# Patient Record
Sex: Male | Born: 1990 | Race: White | Hispanic: No | Marital: Single | Smoking: Never smoker
Health system: Southern US, Community
[De-identification: ages and names within clinical notes are randomized; demographics above are authoritative.]

## PROBLEM LIST (undated history)

## (undated) DIAGNOSIS — K219 Gastro-esophageal reflux disease without esophagitis: Secondary | ICD-10-CM

---
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
## 2008-05-04 ENCOUNTER — Emergency Department (HOSPITAL_COMMUNITY): Admission: EM | Admit: 2008-05-04 | Discharge: 2008-05-04 | Payer: Self-pay | Admitting: Emergency Medicine

## 2008-05-14 ENCOUNTER — Emergency Department (HOSPITAL_COMMUNITY): Admission: EM | Admit: 2008-05-14 | Discharge: 2008-05-14 | Payer: Self-pay | Admitting: Emergency Medicine

## 2008-06-07 ENCOUNTER — Ambulatory Visit: Payer: Self-pay | Admitting: Gastroenterology

## 2008-06-09 ENCOUNTER — Ambulatory Visit (HOSPITAL_COMMUNITY): Admission: RE | Admit: 2008-06-09 | Discharge: 2008-06-09 | Payer: Self-pay | Admitting: Gastroenterology

## 2008-06-15 ENCOUNTER — Emergency Department (HOSPITAL_COMMUNITY): Admission: EM | Admit: 2008-06-15 | Discharge: 2008-06-15 | Payer: Self-pay | Admitting: Emergency Medicine

## 2009-03-23 ENCOUNTER — Emergency Department (HOSPITAL_COMMUNITY): Admission: EM | Admit: 2009-03-23 | Discharge: 2009-03-23 | Payer: Self-pay | Admitting: Emergency Medicine

## 2010-04-28 ENCOUNTER — Emergency Department (HOSPITAL_COMMUNITY): Admission: EM | Admit: 2010-04-28 | Discharge: 2010-04-28 | Payer: Self-pay | Admitting: Emergency Medicine

## 2010-05-18 IMAGING — CR DG CHEST 2V
2 series · 2 of 2 positions shown · non-contrast
Comparison: 06/18/2005

CLINICAL DATA: Shortness of breath

CHEST - 2 VIEW

[view not recorded (1 of 2)]
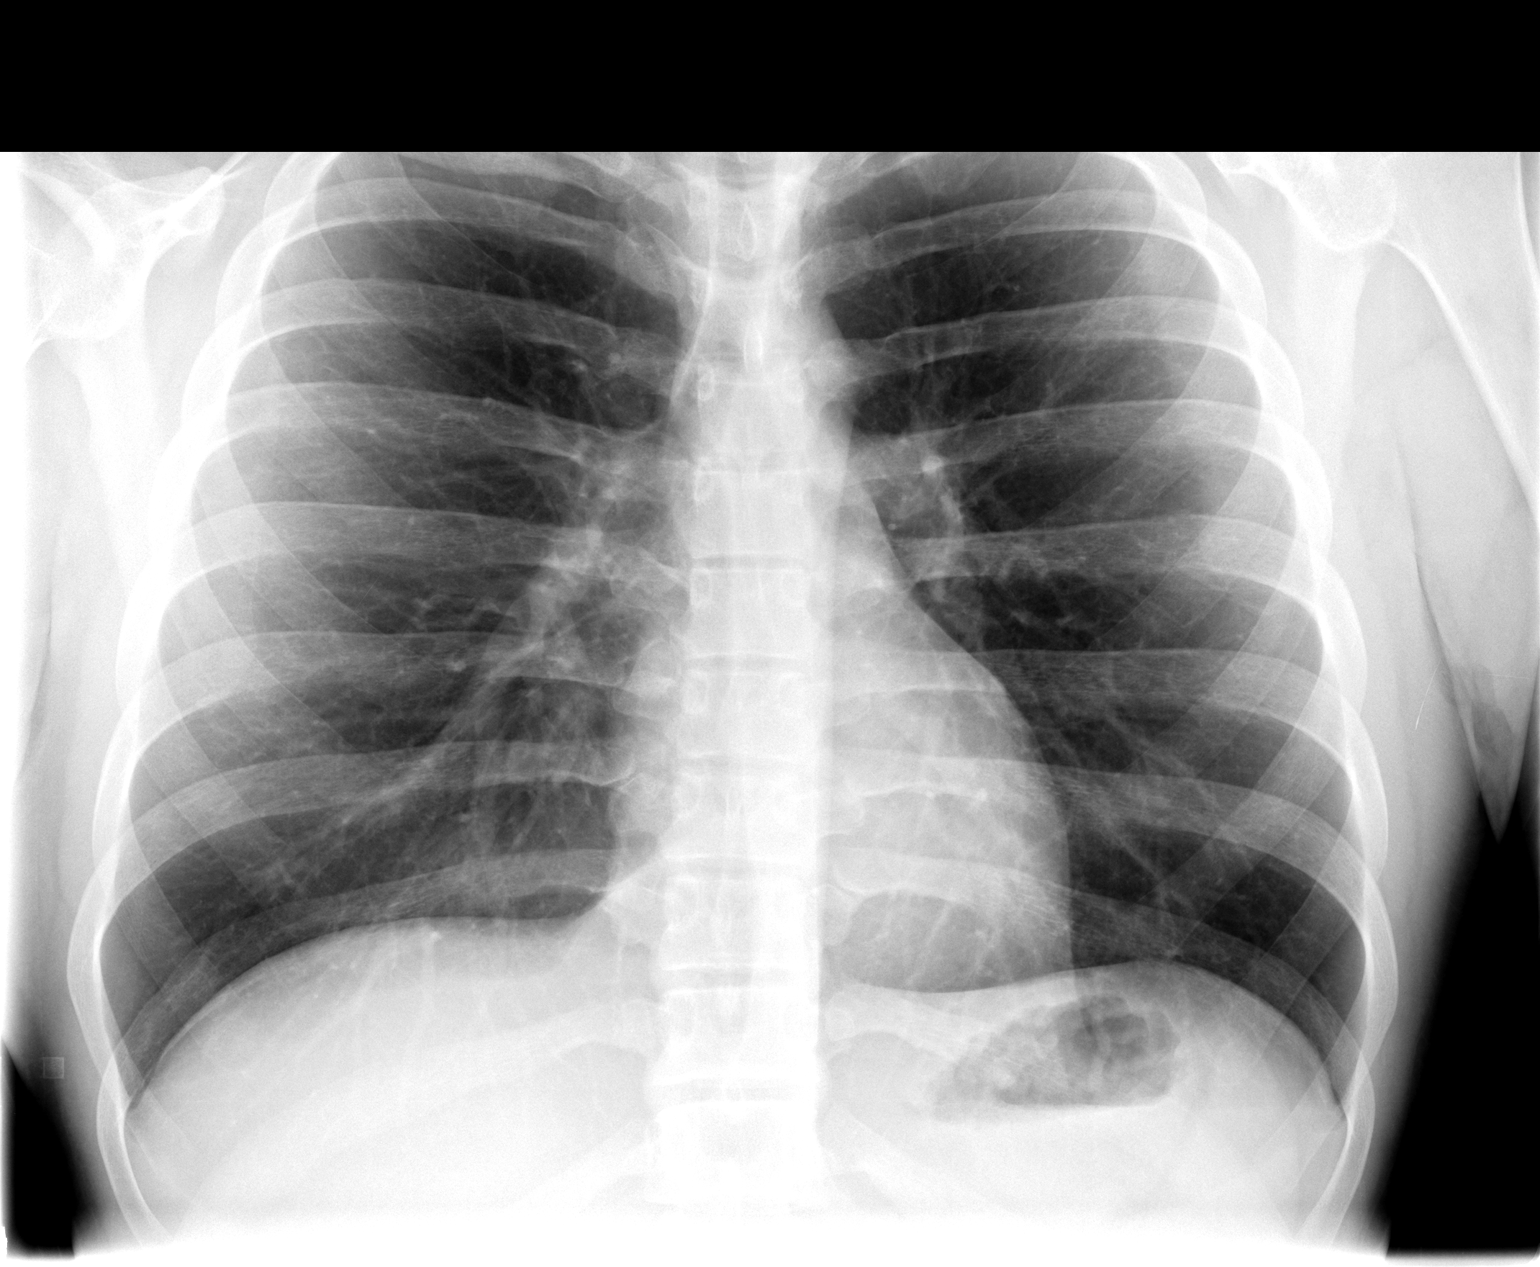

[view not recorded (2 of 2)]
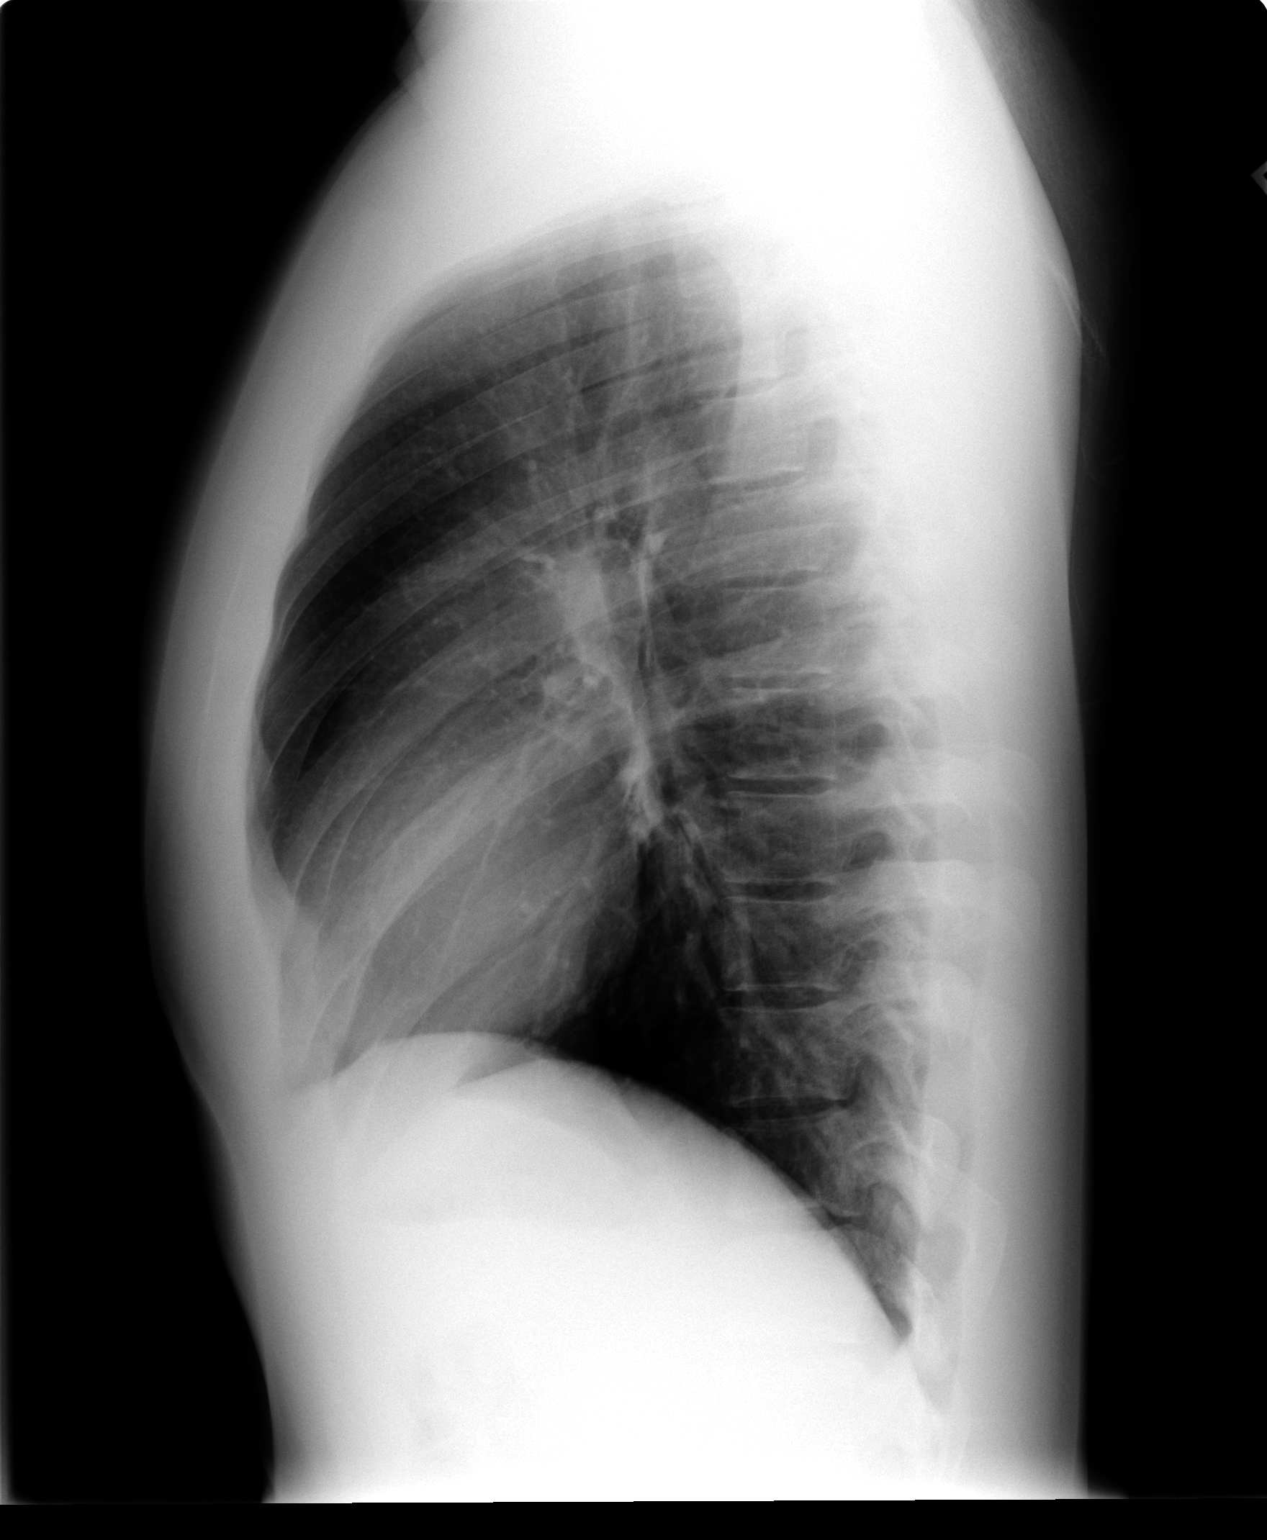

[2 of 2 positions shown; findings below may reference images not displayed]

FINDINGS: The lungs are clear.  The heart is normal.  Mediastinum
and hila are normal.  Osseous structures normal
IMPRESSION: Negative for acute cardiopulmonary process.

## 2010-10-30 ENCOUNTER — Emergency Department (HOSPITAL_COMMUNITY)
Admission: EM | Admit: 2010-10-30 | Discharge: 2010-10-30 | Payer: Self-pay | Source: Home / Self Care | Admitting: Emergency Medicine

## 2011-01-28 LAB — RAPID STREP SCREEN (MED CTR MEBANE ONLY): Streptococcus, Group A Screen (Direct): NEGATIVE

## 2011-01-28 LAB — STREP A DNA PROBE

## 2011-03-05 NOTE — Assessment & Plan Note (Signed)
NAMEMarland Kitchen  Fernando [name], Fernando [name]                CHART#:  [id number]   DATE:  06/07/2008                       DOB:  1991/10/14   CHIEF COMPLAINT:  Abdominal pain and bloating after meals.   SUBJECTIVE:  The patient is here for followup visit.  He was last seen  at the time of EGD in April 2008, when he presented with persistent  dyspepsia and nausea which was made better by constant eating.  It was  felt that he may be having some hypoglycemia as well, given his  dizziness which resolved with meals.  His EGD was normal.  It was  recommended that he have an endocrinology workup to evaluate for  hypoglycemia.  The patient states that he did not have this done.  He  states that he had felt okay.  He continued to have the episodes of  dizziness and nausea which improves when he eats.  He has a new symptom  over the last several weeks and consistent of postprandial bloating and  early satiety.  He gets pain in the epigastrium with meals.  He has  heartburn which is not controlled on ranitidine.  Previously, he did  well on Aciphex, but Medicaid would not pay for it.  He also was on  Protonix in the recent past and  doing well as far as his heartburn.  It  did not seem to help his other symptoms of bloating and early satiety or  nausea, however.  He has lost a total of 16 pounds since March 2008.  He  believes most of it has been in the last couple of months.  He states he  is just not able to eat like he used to.  He denies any dysphagia or  odynophagia.  He states his bowel movements are pretty regular.  Denies  any blood in the stool or melena.   CURRENT MEDICATIONS:  Ranitidine 150 mg b.i.d. started 2 weeks ago.   ALLERGIES:  Codeine causes throat swelling.   PHYSICAL EXAMINATION:  VITAL SIGNS:  Weight 178 pounds, down from 194  pounds March 2008.  Height 5 feet 6 inches, temp 98.1, blood pressure  110/78, and pulse 72.  GENERAL:  A pleasant, well-nourished, well-developed Caucasian male in  no  acute distress.  SKIN:  Warm and dry.  No jaundice.  HEENT:  Sclerae nonicteric.  Oropharyngeal mucosa moist and pink.  CHEST:  Lungs are clear to auscultation.  CARDIAC:  Regular rate and rhythm.  Normal S1 and S2.  No murmurs, rubs,  or gallops.  ABDOMEN:  Flat and nondistended.  Positive bowel sounds.  In the right  mid abdomen, just right of the umbilicus, he has focal tenderness.  This  area is a little more full, but no obvious mass.  No rebound or  guarding.  No organomegaly, abdominal bruits, or hernias.  LOWER EXTREMITIES:  No edema.   IMPRESSION:  The patient is a 20 year old gentleman who presents with  ongoing gastrointestinal symptoms, now with new symptoms of postprandial  epigastric pain, abdominal bloating, and discomfort.  He complains of  early satiety.  EGD a year ago was unremarkable.  He was treated for H.  pylori back then.  He is having refractory gastroesophageal reflux  disease, on ranitidine.  Given postprandial epigastric discomfort,  bloating, and early satiety, he  may be having dyspepsia.  Would be  somewhat atypical for biliary source, but this is not excluded.  Focal  tenderness in the right mid abdomen on exam is concerning.  The patient  does not have any change in bowel movements.  Crohn's disease less  likely.  We would recommend CT to exclude the possibility of underlying  or early appendicitis.   PLAN:  1. CT of the abdomen and pelvis with and without contrast.  2. CBC, LFT, and sed rate.  3. Stop ranitidine and begin omeprazole 40 mg daily, #30 with 5      refills.  4. If CT is unremarkable, we will then consider possibility of      excluding biliary etiology for his symptoms as the next step.   ADDENDUM 65784:  CT SCAN w/ ivc 82009: NAIAP.       Tana Coast, P.A.  Electronically Signed     Kassie Mends, M.D.  Electronically Signed    LL/MEDQ  D:  06/07/2008  T:  06/07/2008  Job:  [id number]   cc:   Corrie Mckusick, M.D.

## 2011-03-08 NOTE — H&P (Signed)
NAME:  Fernando [name], Fernando [name]               ACCOUNT NO.:  [id number]   MEDICAL RECORD NO.:  [id number]          PATIENT TYPE:  AMB   LOCATION:  DAY                           FACILITY:  [facility]   PHYSICIAN:  Vickki Hearing, M.D.DATE OF BIRTH:  29-Jun-1991   DATE OF ADMISSION:  DATE OF DISCHARGE:  LH                                HISTORY & PHYSICAL   HISTORY OF PRESENT ILLNESS:  The patient has pins in his right wrist which  need to be removed.   He had open treatment internal fixation of a severely displaced right distal  radius fracture in November 09, 2004.  He presented to have the pins removed  but ate or drank last time and had to be rescheduled.   PAST MEDI CAL/SURGICAL/FAMILY HISTORY:  Reveal only a history of cancer.   SOCIAL HISTORY:  Negative.   ALLERGIES:  No allergies.   MEDICATIONS:  Vicodin.   PHYSICAL EXAMINATION:  VITAL SIGNS:  Weight 165 pounds.  Vital signs stable.  DEVELOPMENT:  Development, grooming, and hygiene normal.  HEENT:  Normal.  NECK:  Supple.  CHEST:  Clear.  HEART:  Rate and rhythm normal.  ABDOMEN:  Soft.   Previous incisions are healed well over the right wrist.  He is  neurovascularly intact.  No deformity is noted.   Radiographs show fracture is healed.   PLAN:  Plan is to take the pins out of the right wrist.      SEH/MEDQ  D:  02/07/2005  T:  02/07/2005  Job:  [id number]

## 2011-03-08 NOTE — H&P (Signed)
NAME:  Fernando [name], Fernando [name]               ACCOUNT NO.:  [id number]   MEDICAL RECORD NO.:  [id number]          PATIENT TYPE:  AMB   LOCATION:  DAY                           FACILITY:  [facility]   PHYSICIAN:  Vickki Hearing, M.D.DATE OF BIRTH:  [birth year]   DATE OF ADMISSION:  DATE OF DISCHARGE:  LH                                HISTORY & PHYSICAL   CHIEF COMPLAINT:  Pins need to be removed from right wrist.   HISTORY OF PRESENT ILLNESS:  This patient underwent open treatment internal  fixation of a severely displaced right distal radius fracture in January of  2006 and he presents now to have the pins removed.  The surgery date was  November 09, 2004.   PAST MEDICAL HISTORY:  Benign.   PAST SURGICAL HISTORY:  No previous surgery.   FAMILY HISTORY:  Family history of cancer.   SOCIAL HISTORY:  Negative.   ALLERGIES:  None.   MEDICATIONS:  Vicodin.   PHYSICAL EXAMINATION:  WEIGHT:  165 pounds.  VITAL SIGNS:  Pulse 64, respiratory rate 18 last recorded.  HEENT:  Normal.  NECK:  Supple.  CHEST:  Clear.  HEART:  Rate and rhythm normal.  ABDOMEN:  Soft.  His right wrist has no clinical deformity.  EXTREMITIES:  There are incisions from previous surgery which are well  healed.  He is neurovascularly intact.   His radiographs show the fracture to be healed.  The pins can be taken out.   DIAGNOSIS:  Right distal radius fracture.   PLAN:  Remove pins from right distal radius.      SEH/MEDQ  D:  01/14/2005  T:  01/14/2005  Job:  [id number]   cc:   [facility]  Fax: [phone]

## 2011-03-08 NOTE — Op Note (Signed)
[name], [name]               ACCOUNT NO.:  [id number]   MEDICAL RECORD NO.:  [id number]          PATIENT TYPE:  AMB   LOCATION:  DAY                           FACILITY:  [facility]   PHYSICIAN:  Vickki Hearing, M.D.DATE OF BIRTH:  1991/03/11   DATE OF PROCEDURE:  02/08/2005  DATE OF DISCHARGE:                                 OPERATIVE REPORT   PREOPERATIVE DIAGNOSIS:  Distal radius fracture.   POSTOPERATIVE DIAGNOSIS:  Distal radius fracture, right wrist.   PROCEDURE:  Removal of hardware, pins, right wrist.   SURGEON:  Dr. Romeo Apple. No assistants.   ANESTHETIC:  General.   HISTORY:  This is a 20 year old male who fractured his right distal radius  at the growth plate. Had an open reduction internal fixation with K-wires  which were buried. He came back for hardware removal.   The patient was identified in preop holding area. He marked his right wrist  as the surgical site. It was countersigned by the surgeon. History and  physical was updated. Antibiotics were given, 1 gram IV Ancef. He was taken  to the operating room for general anesthesia. After successful anesthesia,  his right upper extremity was prepped and draped using sterile technique. A  time-out was taken. Procedure was confirmed as hardware removal right wrist,  [name], antibiotics confirmed to be given within an hour of skin  incision. Hardware and instruments needed for procedure were confirmed to be  in the room.   Using two small stab wounds over the palpable pins, the pins were removed  and the wounds were closed with 3-0 nylon. We pre-injected and post-injected  Marcaine with epinephrine and plain Marcaine respectively and placed sterile  dressing. He was extubated and taken to recovery room in stable condition.  He is discharged on Lortab 5 mg 1 q.4h. #30 with no refills. He will follow-  up on Monday for wound check      SEH/MEDQ  D:  02/08/2005  T:  02/08/2005  Job:  [id number]

## 2011-03-08 NOTE — Consult Note (Signed)
NAME:  Fernando [name], Fernando [name]               ACCOUNT NO.:  [id number]   MEDICAL RECORD NO.:  [id number]          PATIENT TYPE:  AMB   LOCATION:  DAY                           FACILITY:  [facility]   PHYSICIAN:  Kassie Mends, M.D.      DATE OF BIRTH:  [birth year]   DATE OF CONSULTATION:  12/30/2006  DATE OF DISCHARGE:                                 CONSULTATION   REASON FOR CONSULTATION:  Reflux.   REFERRING PHYSICIAN:  Corrie Mckusick, M.D.   HISTORY OF PRESENT ILLNESS:  Fernando [name] is an 20 year old male who  complains of his stomach grumbling.  He states that he gets shaky if he  does not eat.  His weight has been fluctuating.  He feels hungry all the  times, so he is constantly eating.  His H. pylori serology was checked  in February and it was 2.3.  He was treated with amoxicillin and Biaxin  as well as Aciphex.  He felt better, but his symptoms have now returned.  He is more nauseated.  He has to eat something to make the nausea go  way.  The Aciphex has helped with his indigestion, but it kind of wears  off at nighttime.  He denies any vomiting.  He has soft stool.  He has  zero to two to three bowel movements a day.  He denies any blood in his  stool or black tarry stools.  He feels thirsty all the time.  He did  have his glucose checked in Februaryand it was 93.  He denies any  burning when he urinates or blood in his urine.  He says he has normal  urinary patterns.  For the last year, he has been feeling lightheaded or  dizzy if he does not eat.  He states that protein sticks with him  better.  Occasionally, he has sharp epigastric pain especially if he  eats.   PAST MEDICAL HISTORY:  History of H. pylori infection.   PAST SURGICAL HISTORY:  Wrist surgery.   ALLERGIES:  BENADRYL makes him feel like he cannot breathe.  IBUPROFEN  makes him feel like he cannot breathe.   MEDICATIONS:  1. Aciphex 20 mg twice a day.  2. Amoxicillin 500 mg three times a day from the dentist.   FAMILY  HISTORY:  His grandmother had colon cancer at age 55.  His  grandfather had colon cancer at age 60.  He denies any family history of  uterine, breast or ovarian cancer.   SOCIAL HISTORY:  He has a child that is 1 year and 3 months.  He is  working on his GED from the [employer].  He quit smoking 1 year  ago.  Denies any alcohol use.  His mother accompanied him to the visit.   REVIEW OF SYSTEMS:  Per the HPI, otherwise all systems negative.   PHYSICAL EXAMINATION:  VITAL SIGNS:  Weight 194 pounds, height 5 feet 8  inches, BMI 29.5 (overweight).  Temperature 98.5, blood pressure 110/88,  pulse 80.  GENERAL:  He is no apparent distress, alert and orient x4.  HEENT:  Atraumatic, normocephalic.  Pupils equal, react to light.  Mouth  with no oral lesions.  Posterior pharynx without erythema or exudate.  NECK:  Full range of motion with no lymphadenopathy.  LUNGS:  Clear to  auscultation bilaterally.  CARDIOVASCULAR:  Regular rhythm, no murmur,  normal S1 and S2.  ABDOMEN:  Bowel sounds present, soft, nontender,  nondistended.  No rebound or guarding.  Unable to appreciate  hepatosplenomegaly, abdominal bruits or pulsatile masses.  EXTREMITIES:  Without cyanosis, clubbing or edema.  NEUROLOGIC:  No focal neurologic deficits.   LABORATORY DATA AND X-RAY FINDINGS:  Alk phos 115, AST 60 and ALT 16,  albumin 4.9, total bilirubin 0.6.   ASSESSMENT:  Fernando [name] is an 20 year old male with nausea that is  alleviated with food.  He also has epigastric pain.  He may have peptic  ulcer disease and differential diagnosis includes mucosa-associated  lymphoid tissue lymphoma (MALT) lymphoma or eosinophilic gastritis.  Thank you for allowing me to see Fernando [name] in consultation.   RECOMMENDATIONS:  1. An EGD will be performed to further investigate his significant      nausea and epigastric pain.  2. If the EGD does not reveal etiology for his significant nausea,      then other etiologies  may need to be investigated to include an      endocrinology referral.  3. A follow up appointment will be arranged after his endoscopy is      completed.      Kassie Mends, M.D.  Electronically Signed     SM/MEDQ  D:  01/01/2007  T:  01/03/2007  Job:  [id number]   cc:   Corrie Mckusick, M.D.  Fax: [phone]

## 2011-03-08 NOTE — Op Note (Signed)
NAME:  REBEKAH, SPRINKLE NO.:  [id number]   MEDICAL RECORD NO.:  [id number]          PATIENT TYPE:  EMS   LOCATION:  ED                            FACILITY:  [facility]   PHYSICIAN:  Vickki Hearing, M.D.DATE OF BIRTH:  [birth year]   DATE OF PROCEDURE:  DATE OF DISCHARGE:                                 OPERATIVE REPORT   INDICATIONS FOR PROCEDURE:  Severely displaced right distal radius fracture.   PREOPERATIVE DIAGNOSIS:  Closed right distal radius fracture.   POSTOPERATIVE DIAGNOSIS:  Closed right distal radius fracture, distal  radioulnar joint subluxation/dislocation.   SURGEON:  Vickki Hearing, M.D.   ANESTHESIA:  General.   TOURNIQUET TIME:  Fifty-six minutes.   OPERATIVE FINDINGS:  Open comminuted distal radius fracture with distal  radioulnar joint dislocation dorsally.   FIXATION USED:  Three K-wires.   APPROACH USED:  Dorsal approach to the third and fourth dorsal compartments.   DESCRIPTION OF PROCEDURE:  This is a 20 year old male who was identified in  the ER.  His right wrist was marked as a surgical site.  He was taken to the  operating room, given anesthetic and Ancef, and sterile prep and drape of  his right upper extremity.  Time out was taken in the procedure.  Extremity  _________ confirmed as the right upper extremity.   Closed manipulation was attempted but was unsuccessful; therefore, the  fracture was opened through the third and fourth dorsal compartments.  Extensor tendons were protected.  Extensor retinaculum was entered.  The  extensor pollicis longus tendon was moved radially and out of the way.  A  periosteal elevator was used to gently lift the distal fragment back over  the proximal fragment and ulnarly.  This reduced the fracture, and the  radiographs confirmed that; however, it was noted on the lateral view that  the distal ulna was subluxated and dislocated dorsally; therefore, the arm  was supinated, and a K-wire was  driven across the [facility].  The other two K-  wires that were placed were buried beneath the skin, and these were placed  across the fracture site.  The wound was irrigated.  The dorsal comminuted  bone was used for autogenous bone graft.  The wound was closed by closing  the retinaculum, a subcu layer, and closing the skin.  We used 2-0 and 3-0  Monocryl suture.  We injected 30 cc of Marcaine, released the tourniquet,  applied a volar splint.  We extubated the patient, who was taken to the  recovery room in stable condition.   We would like to follow up on Wednesday.  He is discharged on Phenergan 25  mg 1 q.6h. p.r.n. nausea, Vicodin 5 mg 1 q.4h. p.r.n. #60 with 2 refills.  This is for pain.  He is also given ibuprofen 800 mg p.o. t.i.d. #90 with 2  refills, also for pain and swelling.      SEH/MEDQ  D:  11/09/2004  T:  11/09/2004  Job:  [id number]

## 2011-03-08 NOTE — Op Note (Signed)
Fernando [name], Fernando [name]               ACCOUNT NO.:  [id number]   MEDICAL RECORD NO.:  [id number]          PATIENT TYPE:  AMB   LOCATION:  DAY                           FACILITY:  [facility]   PHYSICIAN:  Kassie Mends, M.D.      DATE OF BIRTH:  [birth year]   DATE OF PROCEDURE:  01/28/2007  DATE OF DISCHARGE:  01/28/2007                               OPERATIVE REPORT   REFERRING PHYSICIAN:  Corrie Mckusick, M.D.   PROCEDURE:  Esophagogastroduodenoscopy.   INDICATION FOR EXAM:  Fernando [name] is an 20 year old male who complains  of persistent dyspepsia and nausea unless he is constantly eating.  His  body mass index is 29.8 and he is overweight.  He also complains of  epigastric pain.  His nausea is alleviated by food.  This EGD is  performed to investigate the reason for his nausea and his epigastric  pain.   FINDINGS:  Normal esophagus without evidence of Barrett's, erosions, or  ulcerations.  Normal stomach.  Normal duodenal bulb and second portion  of the duodenum.   RECOMMENDATIONS:  1. I spoke with Dr. Phillips Odor and recommended an endocrinology workup to      evaluate for hypoglycemia.  His nausea is often associated with      dizziness.  2. He is also asked to avoid gastric irritants.  3. It is possible that his epigastric pain may be related to      gastritis, and he may continue his AcipHex.  4. He has a follow-up appointment to see me in 6 weeks to reevaluate      his epigastric pain and his nausea.  5. He is given a handout on gastric irritants.   MEDICATIONS:  1. Demerol 100 mg IV.  2. Versed 8 mg IV.  3. Phenergan 12.5 mg IV.   PROCEDURE TECHNIQUE:  Physical exam was performed and informed consent  was obtained from the patient after explaining benefits, risks and  alternatives to the procedure.  He was connected to the monitor and  placed in left lateral position.  Continuous oxygen was provided by  nasal cannula and IV medicine was administered through an indwelling  cannula.  After administration of sedation, the patient's esophagus was  intubated and the scope was  advanced under direct visualization to the second portion of the  duodenum.  The scope was subsequently removed slowly by carefully  examine the color, texture, anatomy and integrity of the mucosa on the  way out.  The patient was recovered in endoscopy and discharged to home  in satisfactory condition.      Kassie Mends, M.D.  Electronically Signed     SM/MEDQ  D:  01/29/2007  T:  01/30/2007  Job:  [id number]   cc:   Corrie Mckusick, M.D.  Fax: [phone]

## 2011-07-19 LAB — CBC
HCT: 43.5
MCHC: 34.2
MCV: 89.1
Platelets: 193
RDW: 13.1

## 2011-07-19 LAB — DIFFERENTIAL
Basophils Absolute: 0
Basophils Relative: 1
Eosinophils Absolute: 0.1
Eosinophils Relative: 2
Lymphs Abs: 1.9
Neutrophils Relative %: 49

## 2011-07-19 LAB — BASIC METABOLIC PANEL
BUN: 15
CO2: 31
Chloride: 104
Creatinine, Ser: 0.79
Glucose, Bld: 83
Potassium: 4.2

## 2012-02-14 ENCOUNTER — Emergency Department: Payer: Self-pay | Admitting: *Deleted

## 2012-02-23 ENCOUNTER — Emergency Department: Payer: Self-pay | Admitting: Emergency Medicine

## 2017-07-14 ENCOUNTER — Other Ambulatory Visit: Payer: Self-pay | Admitting: Student

## 2017-07-14 DIAGNOSIS — K5900 Constipation, unspecified: Secondary | ICD-10-CM

## 2017-07-14 DIAGNOSIS — R1010 Upper abdominal pain, unspecified: Secondary | ICD-10-CM

## 2017-07-14 DIAGNOSIS — R140 Abdominal distension (gaseous): Secondary | ICD-10-CM

## 2017-07-14 DIAGNOSIS — K30 Functional dyspepsia: Secondary | ICD-10-CM

## 2017-07-18 ENCOUNTER — Ambulatory Visit
Admission: RE | Admit: 2017-07-18 | Discharge: 2017-07-18 | Disposition: A | Discharge status: Home / Self Care | Payer: 59 | Source: Ambulatory Visit | Attending: Student | Admitting: Student

## 2017-07-18 DIAGNOSIS — K219 Gastro-esophageal reflux disease without esophagitis: Secondary | ICD-10-CM | POA: Insufficient documentation

## 2017-07-18 DIAGNOSIS — R140 Abdominal distension (gaseous): Secondary | ICD-10-CM | POA: Insufficient documentation

## 2017-07-18 DIAGNOSIS — R1010 Upper abdominal pain, unspecified: Secondary | ICD-10-CM | POA: Diagnosis not present

## 2017-07-18 DIAGNOSIS — K30 Functional dyspepsia: Secondary | ICD-10-CM | POA: Diagnosis not present

## 2017-07-18 DIAGNOSIS — K5900 Constipation, unspecified: Secondary | ICD-10-CM | POA: Insufficient documentation

## 2019-02-23 ENCOUNTER — Other Ambulatory Visit: Payer: Self-pay | Admitting: Student

## 2019-02-23 DIAGNOSIS — R140 Abdominal distension (gaseous): Secondary | ICD-10-CM

## 2019-02-23 DIAGNOSIS — K219 Gastro-esophageal reflux disease without esophagitis: Secondary | ICD-10-CM

## 2019-02-23 DIAGNOSIS — K2970 Gastritis, unspecified, without bleeding: Secondary | ICD-10-CM

## 2019-03-08 ENCOUNTER — Other Ambulatory Visit: Payer: Self-pay

## 2019-03-08 ENCOUNTER — Ambulatory Visit
Admission: RE | Admit: 2019-03-08 | Discharge: 2019-03-08 | Disposition: A | Discharge status: Home / Self Care | Payer: BLUE CROSS/BLUE SHIELD | Source: Ambulatory Visit | Attending: Student | Admitting: Student

## 2019-03-08 DIAGNOSIS — K2970 Gastritis, unspecified, without bleeding: Secondary | ICD-10-CM | POA: Insufficient documentation

## 2019-03-08 DIAGNOSIS — K219 Gastro-esophageal reflux disease without esophagitis: Secondary | ICD-10-CM

## 2019-03-08 DIAGNOSIS — R140 Abdominal distension (gaseous): Secondary | ICD-10-CM | POA: Diagnosis present

## 2019-03-16 ENCOUNTER — Ambulatory Visit: Payer: 59

## 2019-09-14 ENCOUNTER — Other Ambulatory Visit: Payer: Self-pay

## 2019-09-14 DIAGNOSIS — Z20822 Contact with and (suspected) exposure to covid-19: Secondary | ICD-10-CM

## 2019-09-15 LAB — NOVEL CORONAVIRUS, NAA: SARS-CoV-2, NAA: NOT DETECTED

## 2019-10-11 ENCOUNTER — Ambulatory Visit
Admission: EM | Admit: 2019-10-11 | Discharge: 2019-10-11 | Disposition: A | Discharge status: Home / Self Care | Payer: BC Managed Care – PPO | Attending: Family Medicine | Admitting: Family Medicine

## 2019-10-11 ENCOUNTER — Other Ambulatory Visit: Payer: Self-pay

## 2019-10-11 DIAGNOSIS — R509 Fever, unspecified: Secondary | ICD-10-CM

## 2019-10-11 DIAGNOSIS — R05 Cough: Secondary | ICD-10-CM

## 2019-10-11 DIAGNOSIS — R519 Headache, unspecified: Secondary | ICD-10-CM

## 2019-10-11 DIAGNOSIS — B349 Viral infection, unspecified: Secondary | ICD-10-CM

## 2019-10-11 HISTORY — DX: Gastro-esophageal reflux disease without esophagitis: K21.9

## 2019-10-11 LAB — RAPID INFLUENZA A&B ANTIGENS
Influenza A (ARMC): NEGATIVE
Influenza B (ARMC): NEGATIVE

## 2019-10-11 MED ORDER — IBUPROFEN 600 MG PO TABS
600.0000 mg | ORAL_TABLET | Freq: Once | ORAL | Status: AC
  Administered 2019-10-11: 600 mg via ORAL

## 2019-10-11 NOTE — ED Triage Notes (Signed)
Pt with 2 days ago starting with headache and cough. Next day felt like his chest was on fire when he took a deep breath. Endorses fever today. Low appetite.

## 2019-10-11 NOTE — ED Provider Notes (Signed)
[facility]    CSN: [id number] Arrival date & time: 10/11/19  1805      History   Chief Complaint Chief Complaint  Patient presents with  . Cough  . Headache    HPI Fernando [name] is a 28 y.o. male. who presents with HA x 2 days ago and has been feeling burning sensation in his chest when he breaths in and this happens off and on. When he went home took his temp and was 101.9. He had been chilling all day which is unusual for him. He also has a mild productive cough since yesterday and worse today, but has not spit it out. Denies being sick at all in the past 2-3 weeks. Has felt a little dizzy today when he gets up and if moves his head a little, feels off balance. Has been pushing a lot of water today. Denies loss of taste or smell. Had mild nausea today only once. Denies diarrhea. Appetite is low. Denies SOB, but has felt fatigued when walking at work the distance he normally walks.   Past Medical History:  Diagnosis Date  . GERD (gastroesophageal reflux disease)     There are no problems to display for this patient.   Past Surgical History:  Procedure Laterality Date  . WRIST SURGERY         Home Medications    Prior to Admission medications   Medication Sig Start Date End Date Taking? Authorizing Provider  famotidine (PEPCID) 40 MG tablet TAKE 1 TABLET BY MOUTH EVERY NIGHT AT BEDTIME 04/20/19  Yes [provider]  pantoprazole (PROTONIX) 40 MG tablet TAKE 1 TABLET(40 MG) BY MOUTH EVERY DAY 30 MINUTES BEFORE BREAKFAST 05/26/19  Yes [provider]    Family History History reviewed. No pertinent family history.  Social History Social History   Tobacco Use  . Smoking status: Never Smoker  . Smokeless tobacco: Never Used  Substance Use Topics  . Alcohol use: Yes    Comment: social  . Drug use: Never     Allergies   Codeine and Sulfa antibiotics   Review of Systems Review of Systems   Physical Exam Triage Vital  Signs ED Triage Vitals  Enc Vitals Group     BP 10/11/19 1841 (!) 138/91     Pulse Rate 10/11/19 1841 (!) 107     Resp 10/11/19 1841 20     Temp 10/11/19 1841 (!) 102.2 F (39 C)     Temp Source 10/11/19 1841 Oral     SpO2 10/11/19 1841 99 %     Weight 10/11/19 1844 210 lb (95.3 kg)     Height 10/11/19 1844 5\' 8"  (1.727 m)     Head Circumference --      Peak Flow --      Pain Score 10/11/19 1843 2     Pain Loc --      Pain Edu? --      Excl. in GC? --    No data found.  Updated Vital Signs BP (!) 138/91 (BP Location: Right Arm)   Pulse (!) 107   Temp (!) 102.2 F (39 C) (Oral)   Resp 20   Ht 5\' 8"  (1.727 m)   Wt 210 lb (95.3 kg)   SpO2 99%   BMI 31.93 kg/m   Visual Acuity Right Eye Distance:   Left Eye Distance:   Bilateral Distance:    Right Eye Near:   Left Eye Near:    Bilateral  Near:     Physical Exam Nursing note reviewed.  Constitutional:      General: He is not in acute distress.    Appearance: He is ill-appearing. He is not toxic-appearing.  HENT:     Head: Normocephalic.     Mouth/Throat:     Mouth: Mucous membranes are moist.  Eyes:     General: No scleral icterus.    Extraocular Movements: Extraocular movements intact.     Pupils: Pupils are equal, round, and reactive to light.     Comments: His eyes look glassy with mild scleral injection. No drainage   Cardiovascular:     Rate and Rhythm: Normal rate and regular rhythm.     Heart sounds: No murmur.  Pulmonary:     Effort: Pulmonary effort is normal.     Breath sounds: Normal breath sounds. No wheezing, rhonchi or rales.  Musculoskeletal:     Cervical back: Neck supple. No rigidity.  Skin:    General: Skin is warm and dry.     Findings: No rash.  Neurological:     Mental Status: He is alert and oriented to person, place, and time.     Gait: Gait normal.  Psychiatric:        Mood and Affect: Mood normal.        Speech: Speech normal.        Behavior: Behavior normal.      UC  Treatments / Results  Labs (all labs ordered are listed, but only abnormal results are displayed) Labs Reviewed  RAPID INFLUENZA A&B ANTIGENS ([facility] ONLY)  NOVEL CORONAVIRUS, NAA (HOSP ORDER, SEND-OUT TO REF LAB; TAT 18-24 HRS)  Rapid flu was neg.   EKG   Radiology No results found.  Procedures Procedures (including critical care time)  Medications Ordered in UC Medications  ibuprofen (ADVIL) tablet 600 mg (600 mg Oral Given 10/11/19 1844)    Initial Impression / Assessment and Plan / UC Course  I have reviewed the triage vital signs and the nursing notes. Pertinent labs results that were available during my care of the patient were reviewed by me and considered in my medical decision making (see chart for details). Supportive care instructions reviewed with pt. Needs to go to ER if he develops SOB. If cover is neg, and still with fever and cough needs to come back for chest xray.    Final Clinical Impressions(s) / UC Diagnoses   Final diagnoses:  Viral illness     Discharge Instructions     Zinc 50 mg every day while sick, then reduce to 25 mg when not sick Vitamin D 5,000 per day with meal Vitamin C 1000 mg Melatonin 3 mg a day   If the covid test is negative and you still have the chest burning, fever  and cough, come back so we do a chest xray on you.     ED Prescriptions    None     PDMP not reviewed this encounter.   Shelby Mattocks, Hershal Coria 10/11/19 2028

## 2019-10-11 NOTE — Discharge Instructions (Signed)
Zinc 50 mg every day while sick, then reduce to 25 mg when not sick Vitamin D 5,000 per day with meal Vitamin C 1000 mg Melatonin 3 mg a day   If the covid test is negative and you still have the chest burning, fever  and cough, come back so we do a chest xray on you.

## 2019-10-13 ENCOUNTER — Telehealth: Payer: Self-pay | Admitting: Emergency Medicine

## 2019-10-13 LAB — NOVEL CORONAVIRUS, NAA (HOSP ORDER, SEND-OUT TO REF LAB; TAT 18-24 HRS): SARS-CoV-2, NAA: DETECTED — AB

## 2019-10-13 NOTE — Telephone Encounter (Signed)

## 2020-02-24 ENCOUNTER — Ambulatory Visit: Payer: BC Managed Care – PPO | Admitting: Dietician

## 2020-04-03 ENCOUNTER — Encounter: Payer: Self-pay | Admitting: Dietician

## 2020-04-03 NOTE — Progress Notes (Signed)
Have not heard back from patient to reschedule his missed appointment from 02/24/20. Sent notification to referring provider.
